# Patient Record
Sex: Male | Born: 1985 | Race: Black or African American | Hispanic: No | Marital: Married | Smoking: Current every day smoker
Health system: Southern US, Community
[De-identification: ages and names within clinical notes are randomized; demographics above are authoritative.]

## PROBLEM LIST (undated history)

## (undated) HISTORY — PX: LEG SURGERY: SHX1003

---
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
## 2006-05-07 ENCOUNTER — Emergency Department (HOSPITAL_COMMUNITY): Admission: EM | Admit: 2006-05-07 | Discharge: 2006-05-07 | Payer: Self-pay | Admitting: Emergency Medicine

## 2007-03-01 ENCOUNTER — Emergency Department (HOSPITAL_COMMUNITY): Admission: EM | Admit: 2007-03-01 | Discharge: 2007-03-01 | Payer: Self-pay | Admitting: Emergency Medicine

## 2007-12-08 ENCOUNTER — Emergency Department (HOSPITAL_COMMUNITY): Admission: EM | Admit: 2007-12-08 | Discharge: 2007-12-08 | Payer: Self-pay | Admitting: Emergency Medicine

## 2007-12-17 ENCOUNTER — Emergency Department (HOSPITAL_COMMUNITY): Admission: EM | Admit: 2007-12-17 | Discharge: 2007-12-17 | Payer: Self-pay | Admitting: Emergency Medicine

## 2009-02-13 ENCOUNTER — Emergency Department (HOSPITAL_COMMUNITY): Admission: EM | Admit: 2009-02-13 | Discharge: 2009-02-13 | Payer: Self-pay | Admitting: Emergency Medicine
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
## 2010-11-09 LAB — GC/CHLAMYDIA PROBE AMP, GENITAL
Chlamydia, DNA Probe: NEGATIVE
GC Probe Amp, Genital: NEGATIVE

## 2011-02-02 ENCOUNTER — Emergency Department (HOSPITAL_COMMUNITY)
Admission: EM | Admit: 2011-02-02 | Discharge: 2011-02-02 | Disposition: A | Discharge status: Home / Self Care | Payer: Self-pay | Attending: Emergency Medicine | Admitting: Emergency Medicine

## 2011-02-02 ENCOUNTER — Encounter: Payer: Self-pay | Admitting: Emergency Medicine

## 2011-02-02 DIAGNOSIS — F17200 Nicotine dependence, unspecified, uncomplicated: Secondary | ICD-10-CM | POA: Insufficient documentation

## 2011-02-02 DIAGNOSIS — Z0000 Encounter for general adult medical examination without abnormal findings: Secondary | ICD-10-CM

## 2011-02-02 DIAGNOSIS — R070 Pain in throat: Secondary | ICD-10-CM | POA: Insufficient documentation

## 2011-02-02 NOTE — ED Provider Notes (Signed)
History     CSN: [id number]  Arrival date & time 02/02/11  7829   First MD Initiated Contact with Patient 02/02/11 2003      Chief Complaint  Patient presents with  . Sore Throat    HPI Onset - today Course - stable Associated symptoms - none  Pt here to be "checked out" as family member with strep throat He has no complaints   PMH - none   History reviewed. No pertinent past surgical history.  No family history on file.  History  Substance Use Topics  . Smoking status: Current Everyday Smoker  . Smokeless tobacco: Not on file  . Alcohol Use: Yes      Review of Systems  Constitutional: Negative for fever.  Respiratory: Negative for shortness of breath.     Allergies  Bee venom; Penicillins; and Shellfish allergy  Home Medications  No current outpatient prescriptions on file.  BP 118/81  Pulse 71  Temp(Src) 98.1 F (36.7 C) (Oral)  Resp 20  Ht 5\' 6"  (1.676 m)  Wt 138 lb (62.596 kg)  BMI 22.27 kg/m2  SpO2 100%  Physical Exam  CONSTITUTIONAL: Well developed/well nourished HEAD AND FACE: Normocephalic/atraumatic EYES: EOMI/PERRL ENMT: Mucous membranes moist, uvula midline, pharynx normal NECK: supple no meningeal signs CV: S1/S2 noted, no murmurs/rubs/gallops noted LUNGS: Lungs are clear to auscultation bilaterally, no apparent distress ABDOMEN: soft, nontender, no rebound or guarding NEURO: Pt is awake/alert, moves all extremitiesx4 EXTREMITIES: pulses normal, full ROM SKIN: warm, color normal PSYCH: no abnormalities of mood noted   ED Course  Procedures   1. Normal physical examination       MDM  Nursing notes reviewed and considered in documentation  No CO exposure per patient, multiple family members with flu like illness but he reports that there has been no CO exposure at home        Joya Gaskins, MD 02/02/11 2150

## 2011-02-02 NOTE — ED Notes (Signed)
Father picked up children from their mother's and was told that mother and 1 child has strep throat.  Denies sore throat or fever.  Dr. Bebe Shaggy in triage to evaluate.

## 2011-02-10 ENCOUNTER — Emergency Department (HOSPITAL_COMMUNITY)
Admission: EM | Admit: 2011-02-10 | Discharge: 2011-02-10 | Disposition: A | Discharge status: Home / Self Care | Payer: Self-pay | Attending: Emergency Medicine | Admitting: Emergency Medicine

## 2011-02-10 ENCOUNTER — Encounter (HOSPITAL_COMMUNITY): Payer: Self-pay

## 2011-02-10 DIAGNOSIS — R197 Diarrhea, unspecified: Secondary | ICD-10-CM | POA: Insufficient documentation

## 2011-02-10 DIAGNOSIS — R61 Generalized hyperhidrosis: Secondary | ICD-10-CM | POA: Insufficient documentation

## 2011-02-10 DIAGNOSIS — K529 Noninfective gastroenteritis and colitis, unspecified: Secondary | ICD-10-CM

## 2011-02-10 DIAGNOSIS — K5289 Other specified noninfective gastroenteritis and colitis: Secondary | ICD-10-CM | POA: Insufficient documentation

## 2011-02-10 DIAGNOSIS — J45909 Unspecified asthma, uncomplicated: Secondary | ICD-10-CM | POA: Insufficient documentation

## 2011-02-10 DIAGNOSIS — F17200 Nicotine dependence, unspecified, uncomplicated: Secondary | ICD-10-CM | POA: Insufficient documentation

## 2011-02-10 DIAGNOSIS — R112 Nausea with vomiting, unspecified: Secondary | ICD-10-CM | POA: Insufficient documentation

## 2011-02-10 DIAGNOSIS — R10819 Abdominal tenderness, unspecified site: Secondary | ICD-10-CM | POA: Insufficient documentation

## 2011-02-10 DIAGNOSIS — R109 Unspecified abdominal pain: Secondary | ICD-10-CM | POA: Insufficient documentation

## 2011-02-10 LAB — COMPREHENSIVE METABOLIC PANEL
ALT: 12 U/L (ref 0–53)
Alkaline Phosphatase: 53 U/L (ref 39–117)
BUN: 11 mg/dL (ref 6–23)
CO2: 29 mEq/L (ref 19–32)
Calcium: 9.8 mg/dL (ref 8.4–10.5)
Chloride: 103 mEq/L (ref 96–112)
Creatinine, Ser: 0.94 mg/dL (ref 0.50–1.35)
GFR calc Af Amer: >90 mL/min (ref >90)
GFR calc non Af Amer: >90 mL/min (ref >90)
Glucose, Bld: 106 mg/dL — ABNORMAL HIGH (ref 70–99)
Potassium: 3.5 mEq/L (ref 3.5–5.1)
Total Bilirubin: 0.5 mg/dL (ref 0.3–1.2)

## 2011-02-10 LAB — URINALYSIS, ROUTINE W REFLEX MICROSCOPIC
Bilirubin Urine: NEGATIVE
Hgb urine dipstick: NEGATIVE
Nitrite: NEGATIVE
Specific Gravity, Urine: 1.015 (ref 1.005–1.030)

## 2011-02-10 LAB — DIFFERENTIAL
Basophils Relative: 0 % (ref 0–1)
Eosinophils Relative: 1 % (ref 0–5)
Monocytes Absolute: 0.4 10*3/uL (ref 0.1–1.0)
Monocytes Relative: 5 % (ref 3–12)
Neutro Abs: 8.4 10*3/uL — ABNORMAL HIGH (ref 1.7–7.7)
Neutrophils Relative %: 87 % — ABNORMAL HIGH (ref 43–77)

## 2011-02-10 LAB — CBC
HCT: 45.8 % (ref 39.0–52.0)
MCH: 29.2 pg (ref 26.0–34.0)
Platelets: 201 10*3/uL (ref 150–400)

## 2011-02-10 MED ORDER — PROMETHAZINE HCL 25 MG PO TABS: 25.0000 mg | ORAL_TABLET | Freq: Four times a day (QID) | ORAL | Status: AC | PRN

## 2011-02-10 MED ORDER — KETOROLAC TROMETHAMINE 30 MG/ML IJ SOLN
30.0000 mg | Freq: Once | INTRAMUSCULAR | Status: AC
  Administered 2011-02-10: 30 mg via INTRAVENOUS
  Filled 2011-02-10: qty 1

## 2011-02-10 MED ORDER — HYOSCYAMINE SULFATE CR 0.375 MG PO CP12: 0.3750 mg | ORAL_CAPSULE | Freq: Two times a day (BID) | ORAL | Status: DC | PRN

## 2011-02-10 MED ORDER — SODIUM CHLORIDE 0.9 % IV BOLUS (SEPSIS)
1000.0000 mL | Freq: Once | INTRAVENOUS | Status: AC
  Administered 2011-02-10: 1000 mL via INTRAVENOUS

## 2011-02-10 MED ORDER — ONDANSETRON HCL 4 MG/2ML IJ SOLN
4.0000 mg | Freq: Once | INTRAMUSCULAR | Status: AC
  Administered 2011-02-10: 4 mg via INTRAVENOUS
  Filled 2011-02-10: qty 2

## 2011-02-10 MED ORDER — SODIUM CHLORIDE 0.9 % IV SOLN
Freq: Once | INTRAVENOUS | Status: AC
  Administered 2011-02-10: 1000 mL via INTRAVENOUS

## 2011-02-10 NOTE — ED Notes (Signed)
Pt reports woke up around 3am with severe abd pain, n/v, and diaphoretic.  Denies diarrhea, lbm was yesterday and was normal per pt.  Pt says pain is around umbilicus, and feels sharp.  Denies any urinary symptoms.

## 2011-02-10 NOTE — ED Provider Notes (Signed)
History   This chart was scribed for Benny Lennert, MD by Clarita Crane. The patient was seen in room APA02/APA02 and the patient's care was started at 10:23AM.   CSN: [id number]  Arrival date & time 02/10/11  0950   First MD Initiated Contact with Patient 02/10/11 1011      Chief Complaint  Patient presents with  . Abdominal Pain    (Consider location/radiation/quality/duration/timing/severity/associated sxs/prior treatment) HPI MARISSA LOWREY is a 26 y.o. male who presents to the Emergency Department complaining of constant moderate to severe abdominal pain described as cramping onset 7 hours ago and persistent since with associated diaphoresis, diarrhea, nausea and vomiting. Reports recent sick contacts with his children experiencing similar symptoms. Denies cough, chest pain, SOB, urinary symptoms.    Past Medical History  Diagnosis Date  . Asthma     Past Surgical History  Procedure Date  . Leg surgery     No family history on file.  History  Substance Use Topics  . Smoking status: Current Everyday Smoker  . Smokeless tobacco: Not on file  . Alcohol Use: Yes     occasionally      Review of Systems  Constitutional: Positive for diaphoresis. Negative for fatigue.  HENT: Negative for congestion, sinus pressure and ear discharge.   Eyes: Negative for discharge.  Respiratory: Negative for cough.   Cardiovascular: Negative for chest pain.  Gastrointestinal: Positive for nausea, vomiting and abdominal pain. Negative for diarrhea.  Genitourinary: Negative for frequency and hematuria.  Musculoskeletal: Negative for back pain.  Skin: Negative for rash.  Neurological: Negative for seizures and headaches.  Hematological: Negative.   Psychiatric/Behavioral: Negative for hallucinations.    Allergies  Bee venom; Penicillins; and Shellfish allergy  Home Medications   Current Outpatient Rx  Name Route Sig Dispense Refill  . HYOSCYAMINE SULFATE ER 0.375 MG PO CP12  Oral Take 1 capsule (0.375 mg total) by mouth 2 (two) times daily as needed for cramping. 12 capsule 0  . PROMETHAZINE HCL 25 MG PO TABS Oral Take 1 tablet (25 mg total) by mouth every 6 (six) hours as needed for nausea. 15 tablet 0    BP 108/71  Pulse 82  Temp 98.3 F (36.8 C)  Resp 18  Ht 5\' 5"  (1.651 m)  Wt 138 lb (62.596 kg)  BMI 22.96 kg/m2  SpO2 98%  Physical Exam  Nursing note and vitals reviewed. Constitutional: He is oriented to person, place, and time. He appears well-developed and well-nourished. No distress.  HENT:  Head: Normocephalic and atraumatic.  Right Ear: External ear normal.  Left Ear: External ear normal.  Mouth/Throat: Oropharynx is clear and moist.       TMs normal bilaterally.   Eyes: EOM are normal. Pupils are equal, round, and reactive to light.  Neck: Neck supple. No tracheal deviation present.  Cardiovascular: Normal rate and regular rhythm.  Exam reveals no gallop and no friction rub.   No murmur heard. Pulmonary/Chest: Effort normal. No respiratory distress. He has no wheezes. He has no rales.  Abdominal: Soft. Bowel sounds are normal. He exhibits no distension. There is tenderness (mild, diffuse).  Musculoskeletal: Normal range of motion. He exhibits no edema.  Neurological: He is alert and oriented to person, place, and time. No sensory deficit.  Skin: Skin is warm and dry.  Psychiatric: He has a normal mood and affect. His behavior is normal.    ED Course  Procedures (including critical care time)  DIAGNOSTIC STUDIES: Oxygen Saturation is  98% on room air, normal by my interpretation.    COORDINATION OF CARE: 1:33PM- Patient informed of lab results. States pain has improved with administration of IVFs, Toradol-30mg  via IV, Zofran-4mg  via IV. Will d/c home. Patient agrees with plan set forth at this time.    Labs Reviewed  DIFFERENTIAL - Abnormal; Notable for the following:    Neutrophils Relative 87 (*)    Neutro Abs 8.4 (*)     Lymphocytes Relative 7 (*)    All other components within normal limits  URINALYSIS, ROUTINE W REFLEX MICROSCOPIC - Abnormal; Notable for the following:    Ketones, ur TRACE (*)    All other components within normal limits  COMPREHENSIVE METABOLIC PANEL - Abnormal; Notable for the following:    Glucose, Bld 106 (*)    All other components within normal limits  CBC   No results found.   1. Gastroenteritis       MDM  N/v viral infection   Pt improved with tx   The chart was scribed for me under my direct supervision.  I personally performed the history, physical, and medical decision making and all procedures in the evaluation of this patient.Benny Lennert, MD 02/10/11 1343

## 2011-02-10 NOTE — ED Notes (Signed)
Pt just went to restroom and reports had diarrhea.

## 2012-11-06 ENCOUNTER — Ambulatory Visit: Payer: Self-pay | Admitting: Family Medicine

## 2012-11-09 ENCOUNTER — Ambulatory Visit: Payer: Self-pay | Admitting: Family Medicine

## 2017-02-13 ENCOUNTER — Emergency Department (HOSPITAL_COMMUNITY): Payer: No Typology Code available for payment source

## 2017-02-13 ENCOUNTER — Encounter (HOSPITAL_COMMUNITY): Payer: Self-pay

## 2017-02-13 ENCOUNTER — Emergency Department (HOSPITAL_COMMUNITY)
Admission: EM | Admit: 2017-02-13 | Discharge: 2017-02-13 | Disposition: A | Discharge status: Home / Self Care | Payer: No Typology Code available for payment source | Attending: Emergency Medicine | Admitting: Emergency Medicine

## 2017-02-13 DIAGNOSIS — Z87891 Personal history of nicotine dependence: Secondary | ICD-10-CM | POA: Insufficient documentation

## 2017-02-13 DIAGNOSIS — R079 Chest pain, unspecified: Secondary | ICD-10-CM | POA: Diagnosis present

## 2017-02-13 DIAGNOSIS — J45909 Unspecified asthma, uncomplicated: Secondary | ICD-10-CM | POA: Diagnosis not present

## 2017-02-13 DIAGNOSIS — R1012 Left upper quadrant pain: Secondary | ICD-10-CM | POA: Diagnosis not present

## 2017-02-13 MED ORDER — OXYCODONE-ACETAMINOPHEN 5-325 MG PO TABS
2.0000 | ORAL_TABLET | Freq: Once | ORAL | Status: AC
  Administered 2017-02-13: 2 via ORAL
  Filled 2017-02-13: qty 2
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
## 2017-05-24 ENCOUNTER — Encounter (HOSPITAL_COMMUNITY): Payer: Self-pay | Admitting: Emergency Medicine

## 2017-05-24 ENCOUNTER — Other Ambulatory Visit: Payer: Self-pay

## 2017-05-24 ENCOUNTER — Emergency Department (HOSPITAL_COMMUNITY)
Admission: EM | Admit: 2017-05-24 | Discharge: 2017-05-24 | Disposition: A | Discharge status: Home / Self Care | Payer: Self-pay | Attending: Emergency Medicine | Admitting: Emergency Medicine

## 2017-05-24 DIAGNOSIS — K0889 Other specified disorders of teeth and supporting structures: Secondary | ICD-10-CM

## 2017-05-24 DIAGNOSIS — K011 Impacted teeth: Secondary | ICD-10-CM | POA: Insufficient documentation

## 2017-05-24 DIAGNOSIS — J45909 Unspecified asthma, uncomplicated: Secondary | ICD-10-CM | POA: Insufficient documentation

## 2017-05-24 DIAGNOSIS — Z87891 Personal history of nicotine dependence: Secondary | ICD-10-CM | POA: Insufficient documentation

## 2017-05-24 MED ORDER — HYDROCODONE-ACETAMINOPHEN 5-325 MG PO TABS: ORAL_TABLET | ORAL | 0 refills | Status: DC

## 2017-05-24 MED ORDER — CLINDAMYCIN HCL 150 MG PO CAPS: 300.0000 mg | ORAL_CAPSULE | Freq: Four times a day (QID) | ORAL | 0 refills | Status: DC

## 2017-05-24 NOTE — ED Notes (Signed)
Pain lower left jaw x days, using oral gel without relief. Jaw swollen complaining of ear pain

## 2017-05-24 NOTE — ED Notes (Signed)
Patient given discharge instruction, area dental list given,  verbalized understand. Patient ambulatory out of the department.

## 2017-05-24 NOTE — ED Triage Notes (Signed)
Pt c/o of left dental pain x 3 days with swelling.

## 2017-05-24 NOTE — Discharge Instructions (Signed)
Follow-up with a dentist soon.  You may contact 1 of the dentist on the list provided.

## 2017-05-24 NOTE — ED Provider Notes (Signed)
[facility] EMERGENCY DEPARTMENT Provider Note   CSN: [id number] Arrival date & time: 05/24/17  1332     History   Chief Complaint Chief Complaint  Patient presents with  . Dental Pain    HPI Benjamin Jimenez is a 32 y.o. male.  HPI   Benjamin Jimenez is a 32 y.o. male who presents to the Emergency Department complaining of left lower dental pain for 3 days.  He also complains of some swelling to his left jaw.  He describes a sharp stabbing type pain to his left lower tooth that radiates into his face and toward his ear.  Pain is associated with chewing.  He states he is used Orajel and over-the-counter pain relievers without relief.  He has not tried to contact a dentist due to lack of insurance.  He denies fever, chills, neck pain, difficulty swallowing or breathing.   Past Medical History:  Diagnosis Date  . Asthma     There are no active problems to display for this patient.   Past Surgical History:  Procedure Laterality Date  . LEG SURGERY       Home Medications    Prior to Admission medications   Medication Sig Start Date End Date Taking? Authorizing Provider  hyoscyamine (LEVSINEX) 0.375 MG 12 hr capsule Take 1 capsule (0.375 mg total) by mouth 2 (two) times daily as needed for cramping. 02/10/11 05/11/11  Bethann Berkshire, MD    Family History History reviewed. No pertinent family history.  Social History Social History   Tobacco Use  . Smoking status: Former Games developer  . Smokeless tobacco: Never Used  Substance Use Topics  . Alcohol use: Yes    Comment: occasionally  . Drug use: No     Allergies   Bee venom; Penicillins; and Shellfish allergy   Review of Systems Review of Systems  Constitutional: Negative for appetite change and fever.  HENT: Positive for dental problem and facial swelling. Negative for congestion, sore throat and trouble swallowing.   Eyes: Negative for pain and visual disturbance.  Musculoskeletal: Negative for neck pain and  neck stiffness.  Neurological: Negative for dizziness, facial asymmetry and headaches.  Hematological: Negative for adenopathy.  All other systems reviewed and are negative.    Physical Exam Updated Vital Signs BP 131/87 (BP Location: Left Arm)   Pulse 62   Temp 97.6 F (36.4 C) (Oral)   Resp 18   Wt 68 kg (150 lb)   SpO2 100%   BMI 24.96 kg/m   Physical Exam  Constitutional: He is oriented to person, place, and time. He appears well-developed and well-nourished. No distress.  HENT:  Head: Normocephalic and atraumatic.  Right Ear: Tympanic membrane and ear canal normal.  Left Ear: Tympanic membrane and ear canal normal.  Mouth/Throat: Uvula is midline, oropharynx is clear and moist and mucous membranes are normal. No trismus in the jaw. Dental caries present. No dental abscesses or uvula swelling.  Tenderness to palpation of the left lower third molar,  Partially erupted and tooth appears impacted.  No facial swelling, obvious dental abscess, trismus, or sublingual abnml.    Neck: Normal range of motion. Neck supple.  Cardiovascular: Normal rate, regular rhythm and normal heart sounds.  No murmur heard. Pulmonary/Chest: Effort normal and breath sounds normal.  Musculoskeletal: Normal range of motion.  Lymphadenopathy:    He has no cervical adenopathy.  Neurological: He is alert and oriented to person, place, and time. He exhibits normal muscle tone. Coordination normal.  Skin: Skin is warm and dry.  Nursing note and vitals reviewed.    ED Treatments / Results  Labs (all labs ordered are listed, but only abnormal results are displayed) Labs Reviewed - No data to display  EKG None  Radiology No results found.  Procedures Procedures (including critical care time)  Medications Ordered in ED Medications - No data to display   Initial Impression / Assessment and Plan / ED Course  I have reviewed the triage vital signs and the nursing notes.  Pertinent labs &  imaging results that were available during my care of the patient were reviewed by me and considered in my medical decision making (see chart for details).     Patient well-appearing.  Airway is patent.  No concerning symptoms for Ludwig's angina or dental abscess.  Pain likely related to impacted third molar.  Patient given dental referral information and agrees to plan with NSAID and antibiotic.  Final Clinical Impressions(s) / ED Diagnoses   Final diagnoses:  Impacted third molar tooth  Pain, dental    ED Discharge Orders    None       Pauline Ausriplett, Ciclaly Mulcahey, PA-C 05/24/17 1614    Bethann BerkshireZammit, Joseph, MD 05/25/17 [phone]

## 2017-07-06 ENCOUNTER — Emergency Department (HOSPITAL_COMMUNITY): Payer: Self-pay

## 2017-07-06 ENCOUNTER — Other Ambulatory Visit: Payer: Self-pay

## 2017-07-06 ENCOUNTER — Emergency Department (HOSPITAL_COMMUNITY)
Admission: EM | Admit: 2017-07-06 | Discharge: 2017-07-06 | Disposition: A | Discharge status: Home / Self Care | Payer: Self-pay | Attending: Emergency Medicine | Admitting: Emergency Medicine

## 2017-07-06 ENCOUNTER — Encounter (HOSPITAL_COMMUNITY): Payer: Self-pay

## 2017-07-06 DIAGNOSIS — J45909 Unspecified asthma, uncomplicated: Secondary | ICD-10-CM | POA: Insufficient documentation

## 2017-07-06 DIAGNOSIS — Z87891 Personal history of nicotine dependence: Secondary | ICD-10-CM | POA: Insufficient documentation

## 2017-07-06 DIAGNOSIS — K0889 Other specified disorders of teeth and supporting structures: Secondary | ICD-10-CM | POA: Insufficient documentation

## 2017-07-06 MED ORDER — OXYCODONE-ACETAMINOPHEN 5-325 MG PO TABS
1.0000 | ORAL_TABLET | Freq: Once | ORAL | Status: AC
Start: 2017-07-06 — End: 2017-07-06
  Administered 2017-07-06: 1 via ORAL
  Filled 2017-07-06: qty 1

## 2017-07-06 MED ORDER — TRAMADOL HCL 50 MG PO TABS: 50.0000 mg | ORAL_TABLET | Freq: Three times a day (TID) | ORAL | 0 refills | Status: DC | PRN

## 2017-07-06 MED ORDER — TRAMADOL HCL 50 MG PO TABS
50.0000 mg | ORAL_TABLET | Freq: Once | ORAL | Status: AC
  Administered 2017-07-06: 50 mg via ORAL
  Filled 2017-07-06: qty 1

## 2017-07-06 NOTE — ED Provider Notes (Signed)
[facility] EMERGENCY DEPARTMENT Provider Note   CSN: [id number] Arrival date & time: 07/06/17  2052     History   Chief Complaint Chief Complaint  Patient presents with  . Dental Pain    HPI Benjamin Jimenez is a 32 y.o. male.  Patient presents with left facial swelling and left lower tooth pain. He was seen at [facility] last Tuesday and started on clindamycin and ibuprofen. He states he has a dental appointment next Tuesday.  The history is provided by the patient. No language interpreter was used.  Dental Pain   This is a recurrent problem. The current episode started more than 1 week ago. The problem occurs constantly. The problem has been gradually worsening. The pain is severe.    Past Medical History:  Diagnosis Date  . Asthma     There are no active problems to display for this patient.   Past Surgical History:  Procedure Laterality Date  . LEG SURGERY          Home Medications    Prior to Admission medications   Medication Sig Start Date End Date Taking? Authorizing Provider  clindamycin (CLEOCIN) 150 MG capsule Take 2 capsules (300 mg total) by mouth 4 (four) times daily. 05/24/17   Triplett, Tammy, PA-C  HYDROcodone-acetaminophen (NORCO/VICODIN) 5-325 MG tablet Take one tab po q 4 hrs prn pain 05/24/17   Triplett, Tammy, PA-C  hyoscyamine (LEVSINEX) 0.375 MG 12 hr capsule Take 1 capsule (0.375 mg total) by mouth 2 (two) times daily as needed for cramping. 02/10/11 05/11/11  Bethann Berkshire, MD    Family History History reviewed. No pertinent family history.  Social History Social History   Tobacco Use  . Smoking status: Former Games developer  . Smokeless tobacco: Never Used  Substance Use Topics  . Alcohol use: Yes    Comment: occasionally  . Drug use: No     Allergies   Bee venom; Penicillins; and Shellfish allergy   Review of Systems Review of Systems  Constitutional: Negative for fever.  HENT: Positive for facial swelling.   All other systems  reviewed and are negative.    Physical Exam Updated Vital Signs BP (!) 129/96 (BP Location: Right Arm)   Pulse 86   Temp 98.2 F (36.8 C) (Oral)   Resp 16   Ht  (1.651 m)   Wt 64.4 kg (142 lb)   SpO2 98%   BMI 23.63 kg/m   Physical Exam  Constitutional: He is oriented to person, place, and time. He appears well-developed and well-nourished. He appears distressed.  HENT:  Mouth/Throat: Mucous membranes are normal. Abnormal dentition. Dental caries present.  Significant left side facial swelling. Is able to open mouth, but limited. No TMJ tenderness.  Eyes: Conjunctivae are normal.  Neck: Neck supple.  Cardiovascular: Normal rate and regular rhythm.  Pulmonary/Chest: Effort normal and breath sounds normal.  Lymphadenopathy:    He has no cervical adenopathy.  Neurological: He is alert and oriented to person, place, and time.  Skin: Skin is warm and dry.  Psychiatric: He has a normal mood and affect.  Nursing note and vitals reviewed.    ED Treatments / Results  Labs (all labs ordered are listed, but only abnormal results are displayed) Labs Reviewed - No data to display  EKG None  Radiology Ct Maxillofacial Wo Contrast  Result Date: 07/06/2017 CLINICAL DATA:  32 y/o  M; left mandibular molar pain. EXAM: CT MAXILLOFACIAL WITHOUT CONTRAST TECHNIQUE: Multidetector CT imaging of the maxillofacial  structures was performed. Multiplanar CT image reconstructions were also generated. COMPARISON:  None. FINDINGS: Osseous: Left posterior most mandibular molar dental carie. No acute facial fracture or mandibular dislocation. Orbits: Negative. No traumatic or inflammatory finding. Sinuses: Mild left maxillary sinus mucosal thickening. Otherwise negative. Soft tissues: There is soft tissue swelling and edema within the left facial subcutaneous fat, left masticator compartment, and left submandibular compartment. No discrete rim enhancing abscess identified. Limited intracranial: No  significant or unexpected finding. IMPRESSION: 1. Left posterior most mandibular molar dental carie. 2. Soft tissue swelling within the left facial subcutaneous fat, left masticator compartment, and left submandibular compartment, likely odontogenic. No discrete rim enhancing abscess identified. These results were called by telephone at the time of interpretation on 07/06/2017 at 10:42 pm to NP Ginnifer Creelman [facility] , who verbally acknowledged these results. Electronically Signed   By: Mitzi Hansen M.D.   On: 07/06/2017 22:44    Procedures Procedures (including critical care time)  Medications Ordered in ED Medications  traMADol (ULTRAM) tablet 50 mg (has no administration in time range)  oxyCODONE-acetaminophen (PERCOCET/ROXICET) 5-325 MG per tablet 1 tablet (1 tablet Oral Given 07/06/17 2200)     Initial Impression / Assessment and Plan / ED Course  I have reviewed the triage vital signs and the nursing notes.  Pertinent labs & imaging results that were available during my care of the patient were reviewed by me and considered in my medical decision making (see chart for details).    [place] narcotics database consulted when establishing treatment plan.   Patient with dentalgia.  Exam not concerning for Ludwig's angina or pharyngeal abscess. Radiography does not reveal deep space abscess. Patient is currently on clindamycin and ibuprofen.Will add limited amount of tramadol for night-time use. Patient has an appointment with a dentist next Tuesday. Discussed return precautions. Pt safe for discharge.  Final Clinical Impressions(s) / ED Diagnoses   Final diagnoses:  Dentalgia    ED Discharge Orders        Ordered    traMADol (ULTRAM) 50 MG tablet  Every 8 hours PRN     07/06/17 2305       Felicie Morn, NP 07/06/17 2316    Dione Booze, MD 07/06/17 [phone]

## 2017-07-06 NOTE — ED Triage Notes (Signed)
Pt reports lower right molar tooth pain. Seen at [facility] on Friday for the same and given antbx, started on Tuesday.

## 2017-07-06 NOTE — Discharge Instructions (Signed)
Follow-up with the dentist as scheduled. Reserve tramadol for pain not controlled or improved with ibuprofen.

## 2017-07-06 NOTE — ED Triage Notes (Signed)
Patient reports that he was taking a left over antibiotic of clindamycin 150 mg QID from Saturday until Tuesday when I went to [facility] and was given a prescription for Clindamycin 300 mg QID.  Also taking ibuprofen 600 mg.

## 2017-07-11 ENCOUNTER — Ambulatory Visit: Payer: Self-pay | Admitting: Family Medicine

## 2018-12-17 IMAGING — CT CT MAXILLOFACIAL W/O CM
3 series · 16 of 47 positions shown, 19 images · non-contrast
Comparison: None.

CLINICAL DATA: 31 y/o  M; left mandibular molar pain.

EXAM:
CT MAXILLOFACIAL WITHOUT CONTRAST
TECHNIQUE: Multidetector CT imaging of the maxillofacial structures was
performed. Multiplanar CT image reconstructions were also generated.

[Series 2: max soft · axial · 0.37mm/px · z∈[-43,+107]mm · 10 of 88 slices shown, 13 images]
[im 7/88  brain]
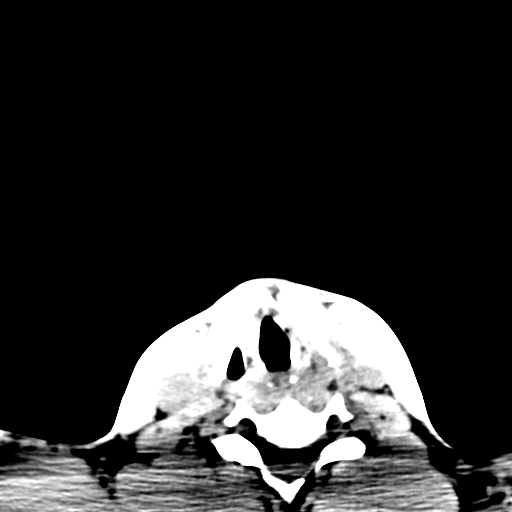
[im 7/88  bone]
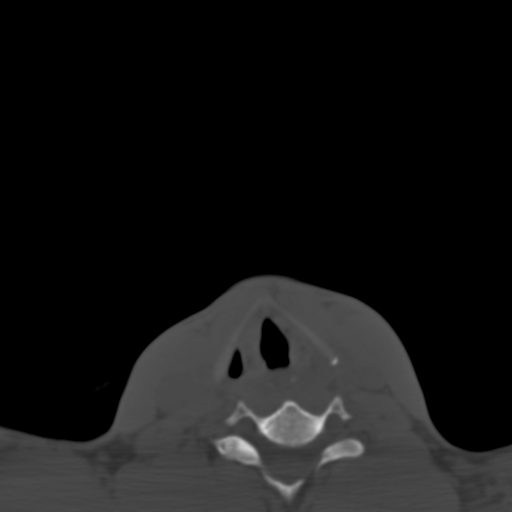
[im 16/88  bone]
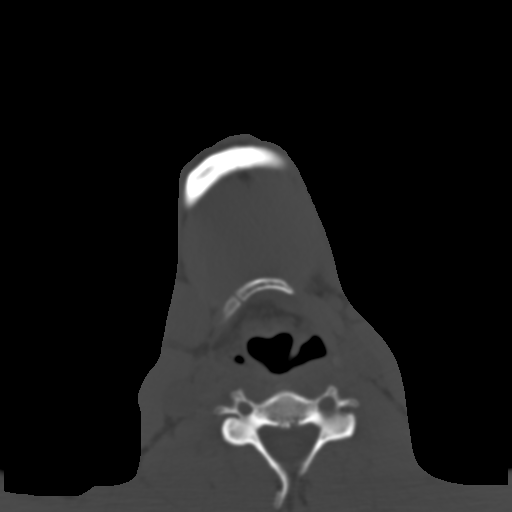
[im 25/88  bone]
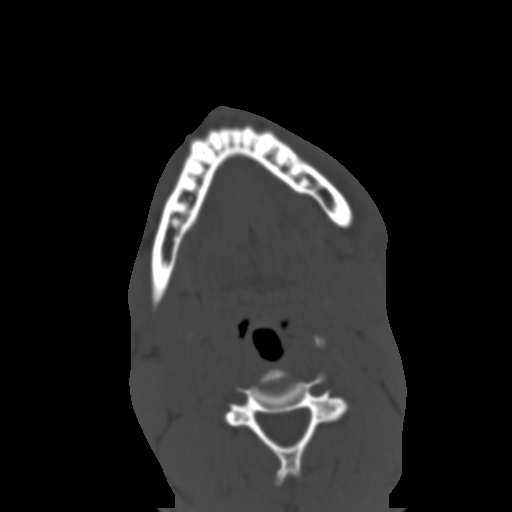
[im 31/88  bone]
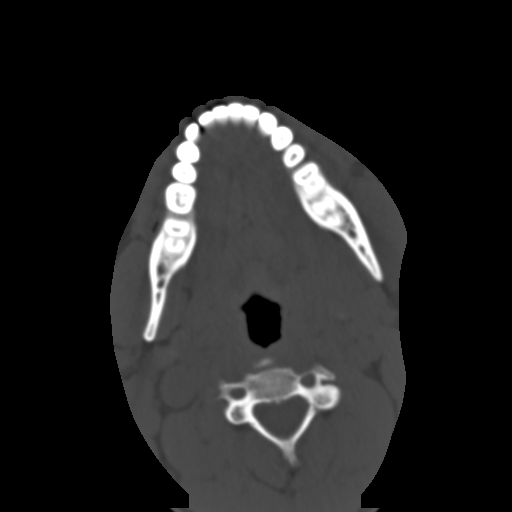
[im 40/88  brain]
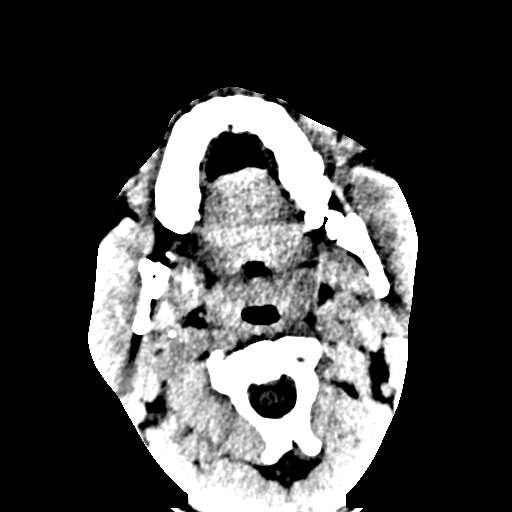
[im 40/88  bone]
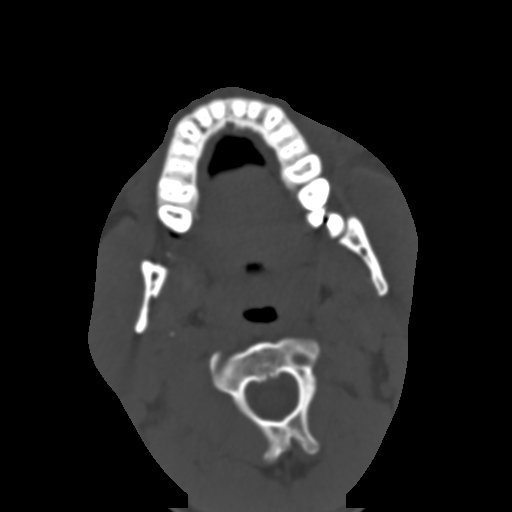
[im 49/88  bone]
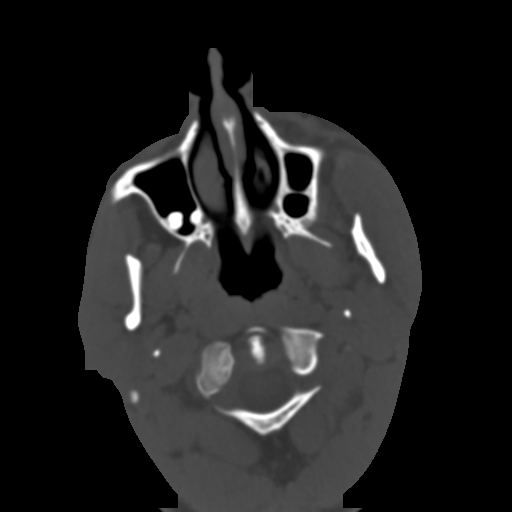
[im 58/88  bone]
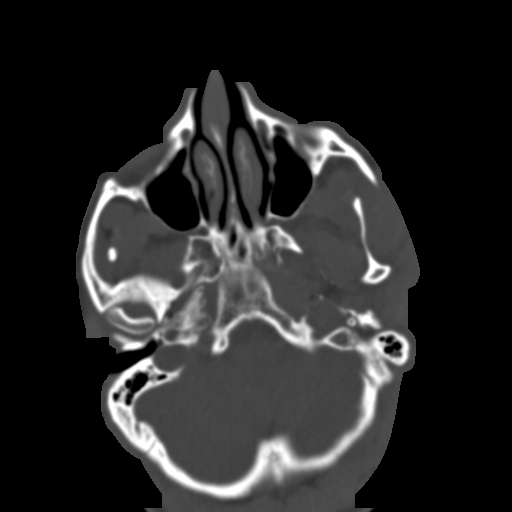
[im 67/88  bone]
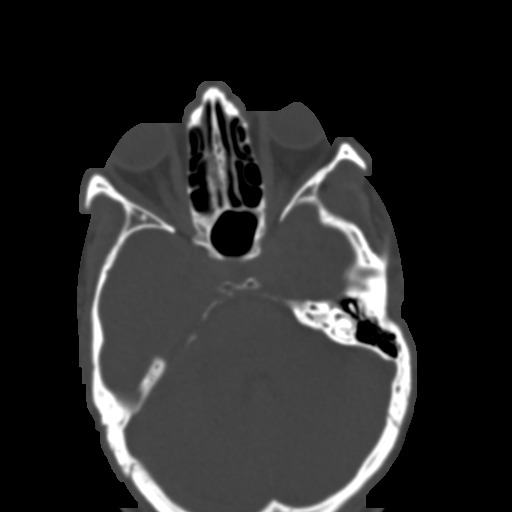
[im 73/88  brain]
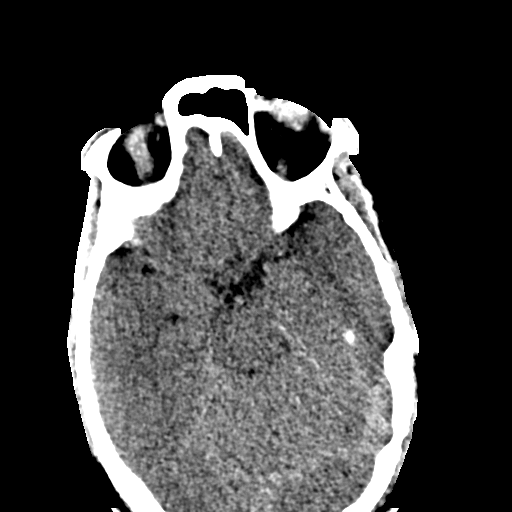
[im 73/88  bone]
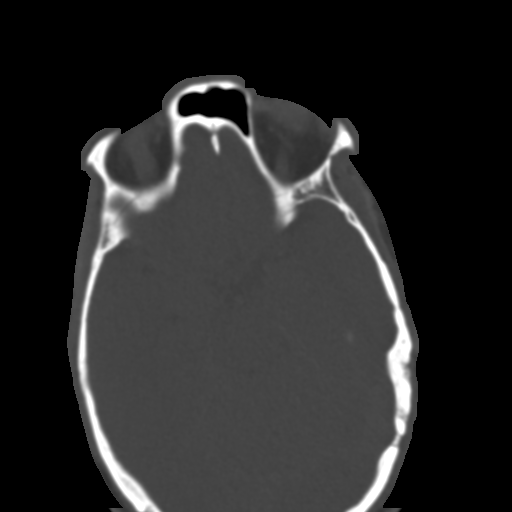
[im 82/88  bone]
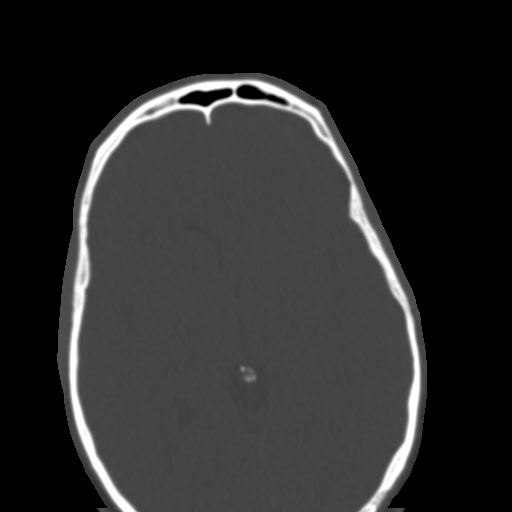

[Series 6: coronal soft · coronal · 0.33mm/px · 3 of 69 slices shown]
[im 23/69  bone]
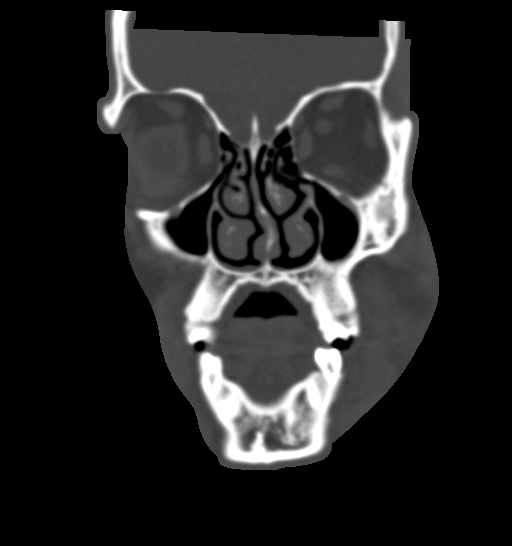
[im 31/69  bone]
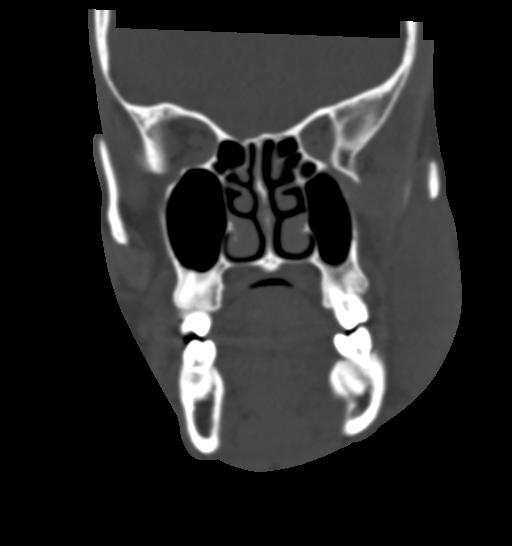
[im 38/69  bone]
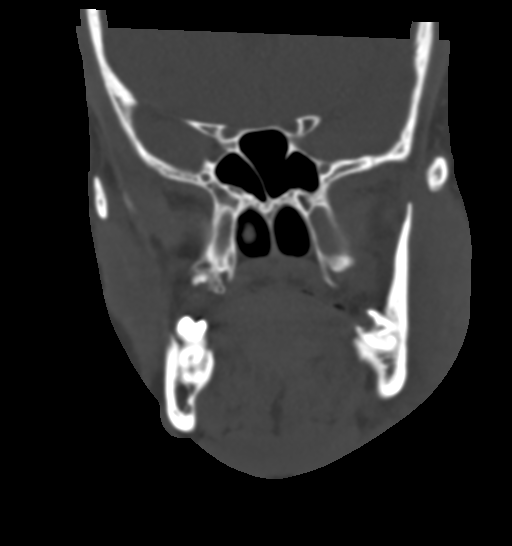

[Series 9: sagittal soft · sagittal · 0.34mm/px · 3 of 79 slices shown]
[im 27/79  bone]
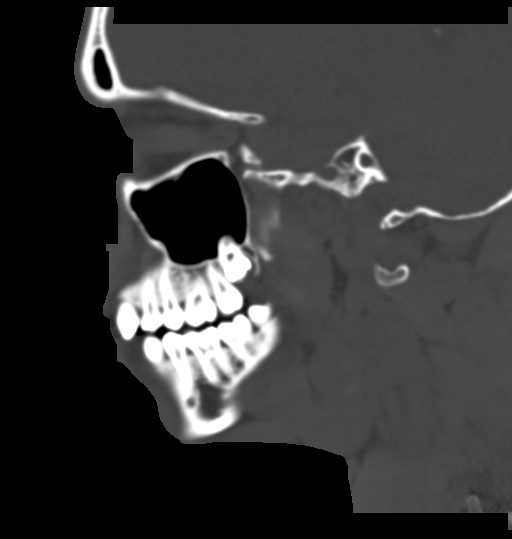
[im 40/79  bone]
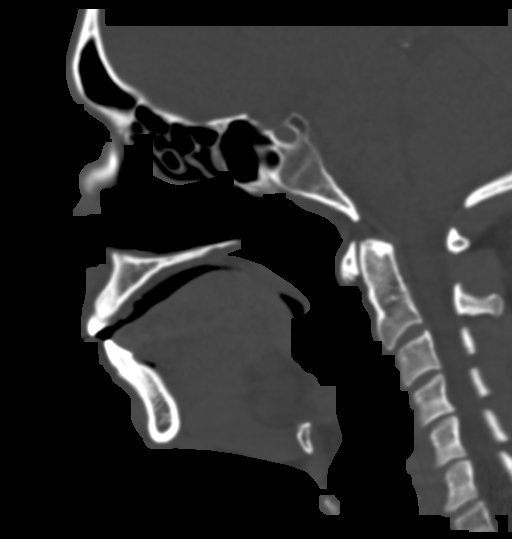
[im 53/79  bone]
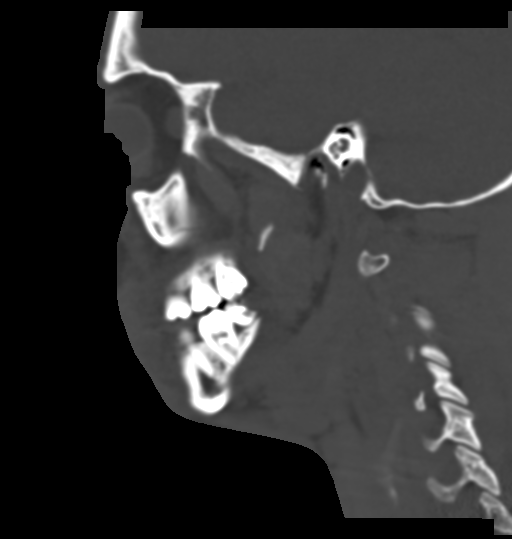

[16 of 47 positions shown; findings below may reference images not displayed]

FINDINGS: Osseous: Left posterior most mandibular molar dental Poroza. No acute
facial fracture or mandibular dislocation.

Orbits: Negative. No traumatic or inflammatory finding.

Sinuses: Mild left maxillary sinus mucosal thickening. Otherwise
negative.

Soft tissues: There is soft tissue swelling and edema within the
left facial subcutaneous fat, left masticator compartment, and left
submandibular compartment. No discrete rim enhancing abscess
identified.

Limited intracranial: No significant or unexpected finding.
IMPRESSION: 1. Left posterior most mandibular molar dental Poroza.
2. Soft tissue swelling within the left facial subcutaneous fat,
left masticator compartment, and left submandibular compartment,
likely odontogenic. No discrete rim enhancing abscess identified.

These results were called by telephone at the time of interpretation
acknowledged these results.

By: Moneybagg Jumper M.D.

## 2021-10-27 ENCOUNTER — Emergency Department (HOSPITAL_COMMUNITY): Payer: Self-pay

## 2021-10-27 ENCOUNTER — Other Ambulatory Visit: Payer: Self-pay

## 2021-10-27 ENCOUNTER — Emergency Department (HOSPITAL_COMMUNITY)
Admission: EM | Admit: 2021-10-27 | Discharge: 2021-10-27 | Disposition: A | Discharge status: Home / Self Care | Payer: Self-pay | Attending: Emergency Medicine | Admitting: Emergency Medicine

## 2021-10-27 ENCOUNTER — Encounter (HOSPITAL_COMMUNITY): Payer: Self-pay | Admitting: *Deleted

## 2021-10-27 DIAGNOSIS — F17200 Nicotine dependence, unspecified, uncomplicated: Secondary | ICD-10-CM | POA: Insufficient documentation

## 2021-10-27 DIAGNOSIS — J4541 Moderate persistent asthma with (acute) exacerbation: Secondary | ICD-10-CM | POA: Insufficient documentation

## 2021-10-27 MED ORDER — ALBUTEROL SULFATE HFA 108 (90 BASE) MCG/ACT IN AERS
2.0000 | INHALATION_SPRAY | RESPIRATORY_TRACT | Status: DC | PRN
  Administered 2021-10-27: 2 via RESPIRATORY_TRACT
  Filled 2021-10-27: qty 6.7

## 2021-10-27 NOTE — ED Triage Notes (Signed)
Pt with c/o SOB since last night. Pt with hx of asthma, states he has run out of his inhaler and has no PCP currently. + cough-productive

## 2021-10-27 NOTE — ED Provider Notes (Addendum)
[facility] EMERGENCY DEPARTMENT Provider Note   CSN: [id number] Arrival date & time: 10/27/21  1056     History  Chief Complaint  Patient presents with   Shortness of Breath    Benjamin Jimenez is a 36 y.o. male.  Patient with history of asthma but has not had an exacerbation for about a year.  Out of albuterol inhaler.  Complaining of shortness of breath since last night.  Patient arrived with wheezing was given albuterol inhaler here wheezing has resolved.  Oxygen saturations 99% patient was on oxygen when I went in the room because he felt better with it.  No leg swelling.  No chest pain.  Past medical history significant for the history of asthma and patient is an everyday smoker.  Patient denies any fevers.  Temp here 17 F       Home Medications Prior to Admission medications   Medication Sig Start Date End Date Taking? Authorizing Provider  clindamycin (CLEOCIN) 150 MG capsule Take 2 capsules (300 mg total) by mouth 4 (four) times daily. 05/24/17   Triplett, Tammy, PA-C  HYDROcodone-acetaminophen (NORCO/VICODIN) 5-325 MG tablet Take one tab po q 4 hrs prn pain 05/24/17   Triplett, Tammy, PA-C  hyoscyamine (LEVSINEX) 0.375 MG 12 hr capsule Take 1 capsule (0.375 mg total) by mouth 2 (two) times daily as needed for cramping. 02/10/11 05/11/11  Bethann Berkshire, MD  traMADol (ULTRAM) 50 MG tablet Take 1 tablet (50 mg total) by mouth every 8 (eight) hours as needed for severe pain. 07/06/17   Felicie Morn, NP      Allergies    Bee venom, Penicillins, and Shellfish allergy    Review of Systems   Review of Systems  Constitutional:  Negative for chills and fever.  HENT:  Negative for ear pain and sore throat.   Eyes:  Negative for pain and visual disturbance.  Respiratory:  Positive for cough, shortness of breath and wheezing.   Cardiovascular:  Negative for chest pain and palpitations.  Gastrointestinal:  Negative for abdominal pain and vomiting.  Genitourinary:  Negative for  dysuria and hematuria.  Musculoskeletal:  Negative for arthralgias and back pain.  Skin:  Negative for color change and rash.  Neurological:  Negative for seizures and syncope.  All other systems reviewed and are negative.   Physical Exam Updated Vital Signs BP 125/89   Pulse (!) 105   Temp 98 F (36.7 C) (Oral)   Resp 11   Ht 1.651 m (5\' 5" )   Wt 63.5 kg   SpO2 99%   BMI 23.30 kg/m  Physical Exam Vitals and nursing note reviewed.  Constitutional:      General: He is not in acute distress.    Appearance: Normal appearance. He is well-developed.  HENT:     Head: Normocephalic and atraumatic.  Eyes:     Extraocular Movements: Extraocular movements intact.     Conjunctiva/sclera: Conjunctivae normal.     Pupils: Pupils are equal, round, and reactive to light.  Cardiovascular:     Rate and Rhythm: Normal rate and regular rhythm.     Heart sounds: No murmur heard. Pulmonary:     Effort: Pulmonary effort is normal. No respiratory distress.     Breath sounds: Wheezing present.  Abdominal:     Palpations: Abdomen is soft.     Tenderness: There is no abdominal tenderness.  Musculoskeletal:        General: No swelling.     Cervical back: Normal range of  motion and neck supple.     Right lower leg: No edema.     Left lower leg: No edema.  Skin:    General: Skin is warm and dry.     Capillary Refill: Capillary refill takes less than 2 seconds.  Neurological:     General: No focal deficit present.     Mental Status: He is alert and oriented to person, place, and time.  Psychiatric:        Mood and Affect: Mood normal.     ED Results / Procedures / Treatments   Labs (all labs ordered are listed, but only abnormal results are displayed) Labs Reviewed - No data to display  EKG EKG Interpretation  Date/Time:  Wednesday October 27 2021 11:17:20 EDT Ventricular Rate:  92 PR Interval:  148 QRS Duration: 81 QT Interval:  346 QTC Calculation: 428 R Axis:   51 Text  Interpretation: Sinus arrhythmia ST elevation, consider inferior injury Confirmed by Fredia Sorrow [id number]) on 10/27/2021 11:25:43 AM  Radiology DG Chest Portable 1 View  Result Date: 10/27/2021 CLINICAL DATA:  Short of breath EXAM: PORTABLE CHEST 1 VIEW COMPARISON:  Chest 02/13/2017 FINDINGS: The heart size and mediastinal contours are within normal limits. Both lungs are clear. The visualized skeletal structures are unremarkable. IMPRESSION: No active disease. Electronically Signed   By: Franchot Gallo M.D.   On: 10/27/2021 11:39    Procedures Procedures    Medications Ordered in ED Medications  albuterol (VENTOLIN HFA) 108 (90 Base) MCG/ACT inhaler 2 puff (2 puffs Inhalation Given 10/27/21 1141)    ED Course/ Medical Decision Making/ A&P                           Medical Decision Making Amount and/or Complexity of Data Reviewed Radiology: ordered.  Risk Prescription drug management.   Wheezing resolved with albuterol inhaler.  Chest x-ray negative no active disease.  Patient did have an EKG done which raise some concerns for inferior injury.  Patient has no chest pain or discomfort at all.  We will reconfirm with patient.  Also really had mostly baseline wandering.  And taking that into account not significantly changed from previous EKG.  We will discharge patient with albuterol inhaler work note.  All wheezing is resolved.   Final Clinical Impression(s) / ED Diagnoses Final diagnoses:  Moderate persistent asthma with exacerbation    Rx / DC Orders ED Discharge Orders     None         Fredia Sorrow, MD 10/27/21 3244    Fredia Sorrow, MD 10/27/21 1228

## 2021-10-27 NOTE — Discharge Instructions (Addendum)
Use albuterol inhaler 2 puffs every 6 hours for the next 7 days then as needed.  Return for any new or worse symptoms.  Return for development of any chest pain.

## 2022-01-07 DIAGNOSIS — Z419 Encounter for procedure for purposes other than remedying health state, unspecified: Secondary | ICD-10-CM | POA: Diagnosis not present

## 2022-02-07 DIAGNOSIS — Z419 Encounter for procedure for purposes other than remedying health state, unspecified: Secondary | ICD-10-CM | POA: Diagnosis not present

## 2022-03-10 DIAGNOSIS — Z419 Encounter for procedure for purposes other than remedying health state, unspecified: Secondary | ICD-10-CM | POA: Diagnosis not present

## 2022-04-08 DIAGNOSIS — Z419 Encounter for procedure for purposes other than remedying health state, unspecified: Secondary | ICD-10-CM | POA: Diagnosis not present

## 2022-05-09 DIAGNOSIS — Z419 Encounter for procedure for purposes other than remedying health state, unspecified: Secondary | ICD-10-CM | POA: Diagnosis not present

## 2022-06-08 DIAGNOSIS — Z419 Encounter for procedure for purposes other than remedying health state, unspecified: Secondary | ICD-10-CM | POA: Diagnosis not present

## 2022-06-24 ENCOUNTER — Emergency Department (HOSPITAL_COMMUNITY): Payer: Medicaid Other

## 2022-06-24 ENCOUNTER — Other Ambulatory Visit: Payer: Self-pay

## 2022-06-24 ENCOUNTER — Encounter (HOSPITAL_COMMUNITY): Payer: Self-pay | Admitting: Emergency Medicine

## 2022-06-24 ENCOUNTER — Emergency Department (HOSPITAL_COMMUNITY)
Admission: EM | Admit: 2022-06-24 | Discharge: 2022-06-25 | Disposition: A | Discharge status: Home / Self Care | Payer: Medicaid Other | Attending: Emergency Medicine | Admitting: Emergency Medicine

## 2022-06-24 DIAGNOSIS — S82891A Other fracture of right lower leg, initial encounter for closed fracture: Secondary | ICD-10-CM | POA: Diagnosis not present

## 2022-06-24 DIAGNOSIS — S99911A Unspecified injury of right ankle, initial encounter: Secondary | ICD-10-CM | POA: Diagnosis not present

## 2022-06-24 DIAGNOSIS — M25571 Pain in right ankle and joints of right foot: Secondary | ICD-10-CM | POA: Insufficient documentation

## 2022-06-24 DIAGNOSIS — S8261XA Displaced fracture of lateral malleolus of right fibula, initial encounter for closed fracture: Secondary | ICD-10-CM | POA: Insufficient documentation

## 2022-06-24 DIAGNOSIS — Y92194 Driveway of other specified residential institution as the place of occurrence of the external cause: Secondary | ICD-10-CM | POA: Diagnosis not present
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
## 2022-06-25 DIAGNOSIS — S8291XA Unspecified fracture of right lower leg, initial encounter for closed fracture: Secondary | ICD-10-CM | POA: Diagnosis not present

## 2022-06-25 MED ORDER — HYDROCODONE-ACETAMINOPHEN 5-325 MG PO TABS: 1.0000 | ORAL_TABLET | Freq: Four times a day (QID) | ORAL | 0 refills | Status: AC | PRN

## 2022-06-25 MED ORDER — IBUPROFEN 400 MG PO TABS
400.0000 mg | ORAL_TABLET | Freq: Once | ORAL | Status: AC
  Administered 2022-06-25: 400 mg via ORAL
  Filled 2022-06-25: qty 1

## 2022-06-25 MED ORDER — HYDROCODONE-ACETAMINOPHEN 5-325 MG PO TABS
1.0000 | ORAL_TABLET | Freq: Once | ORAL | Status: AC
  Administered 2022-06-25: 1 via ORAL
  Filled 2022-06-25: qty 1
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
## 2022-07-01 ENCOUNTER — Encounter (HOSPITAL_BASED_OUTPATIENT_CLINIC_OR_DEPARTMENT_OTHER): Payer: Self-pay | Admitting: Student

## 2022-07-01 ENCOUNTER — Telehealth: Payer: Self-pay | Admitting: Orthopedic Surgery

## 2022-07-01 ENCOUNTER — Other Ambulatory Visit (HOSPITAL_BASED_OUTPATIENT_CLINIC_OR_DEPARTMENT_OTHER): Payer: Self-pay

## 2022-07-01 ENCOUNTER — Ambulatory Visit (INDEPENDENT_AMBULATORY_CARE_PROVIDER_SITE_OTHER): Payer: Medicaid Other | Admitting: Student

## 2022-07-01 DIAGNOSIS — S82831A Other fracture of upper and lower end of right fibula, initial encounter for closed fracture: Secondary | ICD-10-CM

## 2022-07-01 MED ORDER — TRAMADOL HCL 50 MG PO TABS
50.0000 mg | ORAL_TABLET | Freq: Four times a day (QID) | ORAL | 0 refills | Status: AC | PRN
  Filled 2022-07-01: qty 12, 3d supply, fill #0

## 2022-07-01 MED ORDER — MELOXICAM 15 MG PO TABS
15.0000 mg | ORAL_TABLET | Freq: Every day | ORAL | 0 refills | Status: AC
  Filled 2022-07-01: qty 7, 7d supply, fill #0

## 2022-07-01 NOTE — Telephone Encounter (Signed)
Spoke w/the patient, he wanted an appointment today for a cast.  Advised that I do not have any openings today, next available would be next week.  He went to the ED on 06/24/22, he stated that they told him to wait a week then call us to come in.  He is going to call Ortho at [facility].
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
## 2022-07-09 DIAGNOSIS — Z419 Encounter for procedure for purposes other than remedying health state, unspecified: Secondary | ICD-10-CM | POA: Diagnosis not present

## 2022-07-29 ENCOUNTER — Ambulatory Visit (HOSPITAL_BASED_OUTPATIENT_CLINIC_OR_DEPARTMENT_OTHER): Payer: Medicaid Other | Admitting: Student

## 2023-09-03 ENCOUNTER — Other Ambulatory Visit: Payer: Self-pay

## 2023-09-03 ENCOUNTER — Encounter (HOSPITAL_COMMUNITY): Payer: Self-pay

## 2023-09-03 ENCOUNTER — Emergency Department (HOSPITAL_COMMUNITY): Admission: EM | Admit: 2023-09-03 | Discharge: 2023-09-03 | Disposition: A | Discharge status: Home / Self Care

## 2023-09-03 DIAGNOSIS — Z202 Contact with and (suspected) exposure to infections with a predominantly sexual mode of transmission: Secondary | ICD-10-CM | POA: Insufficient documentation

## 2023-09-03 DIAGNOSIS — J45909 Unspecified asthma, uncomplicated: Secondary | ICD-10-CM | POA: Insufficient documentation

## 2023-09-03 LAB — URINALYSIS, ROUTINE W REFLEX MICROSCOPIC
Bacteria, UA: NONE SEEN
Bilirubin Urine: NEGATIVE
Glucose, UA: NEGATIVE mg/dL
Hgb urine dipstick: NEGATIVE
Ketones, ur: NEGATIVE mg/dL
Leukocytes,Ua: NEGATIVE
Nitrite: NEGATIVE
Protein, ur: 30 mg/dL — AB
Specific Gravity, Urine: 1.034 — ABNORMAL HIGH (ref 1.005–1.030)
pH: 6 (ref 5.0–8.0)

## 2023-09-03 NOTE — Discharge Instructions (Addendum)
 You were evaluated in the emergency room for STD testing.  Your results can be viewed on MyChart.  You should typically receive a call for any positive results.  If you do not hear anything, you can call back.  I would recommend abstaining from sexual intercourse until you receive your results.

## 2023-09-03 NOTE — ED Triage Notes (Signed)
 Pt arrives with concern for STD states that he is not having any symptoms but would like to be tested.

## 2023-09-03 NOTE — ED Provider Notes (Signed)
 [place] EMERGENCY DEPARTMENT AT [facility] Provider Note   CSN: [id number] Arrival date & time: 09/03/23  1812     Patient presents with: [facility] TRANSMITTED DISEASE   Benjamin Jimenez is a 38 y.o. male with history of asthma presents with concern for STD exposure.  He is without any symptoms.  No abdominal pain, dysuria, frequency, penile discharge or lesions.   HPI    Past Medical History:  Diagnosis Date   Asthma      Prior to Admission medications   Medication Sig Start Date End Date Taking? Authorizing Provider  HYDROcodone -acetaminophen  (NORCO/VICODIN) 5-325 MG tablet Take 1 tablet by mouth every 6 (six) hours as needed for severe pain. 06/25/22   Midge Golas, MD    Allergies: Bee venom, Penicillins, and Shellfish allergy    Review of Systems  All other systems reviewed and are negative.   Updated Vital Signs BP 117/77 (BP Location: Right Arm)   Pulse 88   Temp 98.6 F (37 C) (Oral)   Resp 18   Ht 5' 5 (1.651 m)   Wt 65.8 kg   SpO2 97%   BMI 24.13 kg/m   Physical Exam Vitals and nursing note reviewed.  Constitutional:      General: He is not in acute distress.    Appearance: He is well-developed.  HENT:     Head: Normocephalic and atraumatic.  Eyes:     Conjunctiva/sclera: Conjunctivae normal.  Pulmonary:     Effort: Pulmonary effort is normal. No respiratory distress.  Musculoskeletal:        General: No swelling.     Cervical back: Neck supple.  Skin:    General: Skin is warm and dry.     Capillary Refill: Capillary refill takes less than 2 seconds.  Neurological:     Mental Status: He is alert.  Psychiatric:        Mood and Affect: Mood normal.     (all labs ordered are listed, but only abnormal results are displayed) Labs Reviewed  URINALYSIS, ROUTINE W REFLEX MICROSCOPIC  GC/CHLAMYDIA PROBE AMP ([place]) NOT AT [facility]    EKG: None  Radiology: No results found.   Procedures   Medications Ordered in  the ED - No data to display                                  Medical Decision Making  This patient presents to the ED with chief complaint(s) of STD testing.  The complaint involves an extensive differential diagnosis and also carries with it a high risk of complications and morbidity.   Pertinent past medical history as listed in HPI  The differential diagnosis includes  STD, UTI   Additional history obtained: Records reviewed Care Everywhere/External Records  Assessment and management:   Hemodynamically stable, nontoxic-appearing patient presenting for routine STD testing.  He is concerned for exposure.  He is without any symptoms.  He is only interested and urine testing.  Offered HIV, syphilis, hep B.  Patient declines.  Will obtain urine for GC testing.  Given that he is asymptomatic, we will hold off on any empiric treatment.  Independent ECG interpretation:  none  Independent labs interpretation:  The following labs were independently interpreted:  STD panel pending  Independent visualization and interpretation of imaging: I independently visualized the following imaging with scope of interpretation limited to determining acute life threatening conditions related to emergency  care: None   Consultations obtained:   none  Disposition:   Patient will be discharged home. The patient has been appropriately medically screened and/or stabilized in the ED. I have low suspicion for any other emergent medical condition which would require further screening, evaluation or treatment in the ED or require inpatient management. At time of discharge the patient is hemodynamically stable and in no acute distress. I have discussed work-up results and diagnosis with patient and answered all questions. Patient is agreeable with discharge plan. We discussed strict return precautions for returning to the emergency department and they verbalized understanding.     Social Determinants of Health:    Patient's impaired access to primary care  increases the complexity of managing their presentation  This note was dictated with voice recognition software.  Despite best efforts at proofreading, errors may have occurred which can change the documentation meaning.       Final diagnoses:  Possible exposure to STD    ED Discharge Orders     None          Donnajean Lynwood DEL, PA-C 09/03/23 1905    Gennaro, Duwaine CROME, DO 09/04/23 2009

## 2023-09-05 LAB — GC/CHLAMYDIA PROBE AMP (~~LOC~~) NOT AT ARMC
Chlamydia: NEGATIVE
Comment: NEGATIVE
Comment: NORMAL
Neisseria Gonorrhea: NEGATIVE
